# Patient Record
Sex: Male | Born: 1990 | Race: White | Hispanic: No | Marital: Single | Smoking: Former smoker
Health system: Southern US, Community
[De-identification: ages and names within clinical notes are randomized; demographics above are authoritative.]

## PROBLEM LIST (undated history)

## (undated) DIAGNOSIS — Z8774 Personal history of (corrected) congenital malformations of heart and circulatory system: Secondary | ICD-10-CM

## (undated) DIAGNOSIS — S52121A Displaced fracture of head of right radius, initial encounter for closed fracture: Secondary | ICD-10-CM

## (undated) HISTORY — PX: CARDIAC SURGERY: SHX584

---

## 2016-03-21 ENCOUNTER — Ambulatory Visit (INDEPENDENT_AMBULATORY_CARE_PROVIDER_SITE_OTHER): Payer: PRIVATE HEALTH INSURANCE | Admitting: Emergency Medicine

## 2016-03-21 VITALS — BP 128/76 | HR 76 | Temp 98.8°F | Resp 18 | Ht 68.0 in | Wt 146.0 lb

## 2016-03-21 DIAGNOSIS — T1592XA Foreign body on external eye, part unspecified, left eye, initial encounter: Secondary | ICD-10-CM

## 2016-03-21 NOTE — Patient Instructions (Addendum)
You have an appointment at [facility] today at 230.  Address: [address] [place], [place], [place] [address].  Phone: [phone]      IF you received an x-ray today, you will receive an invoice from [facility]. Please contact [facility] at [phone] with questions or concerns regarding your invoice.   IF you received labwork today, you will receive an invoice from [facility]. Please contact [facility] at [phone] with questions or concerns regarding your invoice.   Our billing staff will not be able to assist you with questions regarding bills from these companies.  You will be contacted with the lab results as soon as they are available. The fastest way to get your results is to activate your My Chart account. Instructions are located on the last page of this paperwork. If you have not heard from us regarding the results in 2 weeks, please contact this office.

## 2016-03-21 NOTE — Progress Notes (Signed)
Patient ID: Frederick Mills, male   DOB: 07/02/1991, 25 y.o.   MRN: [id number]    By signing my name below, I, Essence Howell, attest that this documentation has been prepared under the direction and in the presence of Collene GobbleSteven A Carmino Ocain, MD Electronically Signed: Charline BillsEssence Howell, ED Scribe 03/21/2016 at 1:48 PM.  Chief Complaint:  Chief Complaint  Patient presents with  . Eye Pain    pt has a piece of metal in his left eye. redness/ x 1 day   HPI: Frederick Mills is a 25 y.o. male who [facility] complaining of persistent left eye pain onset yesterday. 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 He states that he feels the metal in his eye every time he blinks. Pt was wearing safety glasses during the incident. He reports associated left eye redness onset yesterday. Pt has tried flushing his eye without significant relief. He denies any symptoms in his right eye. No known drug allergies.   Pt is self-employed with [employer].   Past Medical History  Diagnosis Date  . Heart murmur    No past surgical history on file. Social History   Social History  . Marital Status: Single    Spouse Name: N/A  . Number of Children: N/A  . Years of Education: N/A   Social History Main Topics  . Smoking status: Never Smoker   . Smokeless tobacco: None  . Alcohol Use: None  . Drug Use: None  . Sexual Activity: Not Asked   Other Topics Concern  . None   Social History Narrative  . None   History reviewed. No pertinent family history. Not on File Prior to Admission medications   Not on File   ROS: The patient denies fevers, chills, night sweats, unintentional weight loss, chest pain, palpitations, wheezing, dyspnea on exertion, nausea, vomiting, abdominal pain, dysuria, hematuria, melena, numbness, weakness, or tingling. +eye pain, +eye redness  All other systems have been reviewed and were otherwise  negative with the exception of those mentioned in the HPI and as above.    PHYSICAL EXAM: Filed Vitals:   03/21/16 1325  BP: 128/76  Pulse: 76  Temp: 98.8 F (37.1 C)  Resp: 18   Body mass index is 22.2 kg/(m^2).  General: Alert, no acute distress HEENT:  Normocephalic, atraumatic, oropharynx patent. [facility]. L eye: tiny 1-2 mm retained foreign body at 10 o'clock.  Cardiovascular: Regular rate and rhythm, no rubs murmurs or gallops. No Carotid bruits, radial pulse intact. No pedal edema.  Respiratory: Clear to auscultation bilaterally. No wheezes, rales, or rhonchi. No cyanosis, no use of accessory musculature Abdominal: No organomegaly, abdomen is soft and non-tender, positive bowel sounds. No masses. Musculoskeletal: Gait intact. No edema, tenderness Skin: No rashes. Neurologic: Facial musculature symmetric. Psychiatric: Patient acts appropriately throughout our interaction. Lymphatic: No cervical or submandibular lymphadenopathy  LABS:  EKG/XRAY:   Primary read interpreted by [facility] Cle[facility]tsaub at [facility].  PROCEDURES: 3 drops of pontocaine instilled. Lid everted and swabbed. Attempts made to remove small foreign body with moist Q-tip without success. Pt tolerated it well. Unable to remove foreign body.   ASSESSMENT/PLAN: [facility]. [facility] was called. Pt will be sent there for evaluation and treatment. I was unable to remove with cotton swab. I personally performed the services described in this documentation, which was scribed in my presence. The recorded information has been reviewed and is  accurate.   Gross sideeffects, risk and benefits, and alternatives of medications d/w patient. Patient is aware that all medications have potential sideeffects and we are unable to predict every sideeffect or drug-drug interaction that may occur.  Lesle Chris MD 03/21/2016 1:32 PM

## 2017-03-12 ENCOUNTER — Other Ambulatory Visit: Payer: Self-pay | Admitting: Orthopedic Surgery

## 2017-03-12 DIAGNOSIS — M25521 Pain in right elbow: Secondary | ICD-10-CM

## 2017-03-12 DIAGNOSIS — M25522 Pain in left elbow: Principal | ICD-10-CM

## 2017-03-13 ENCOUNTER — Ambulatory Visit
Admission: RE | Admit: 2017-03-13 | Discharge: 2017-03-13 | Disposition: A | Discharge status: Home / Self Care | Payer: 59 | Source: Ambulatory Visit | Attending: Orthopedic Surgery | Admitting: Orthopedic Surgery

## 2017-03-13 DIAGNOSIS — M25521 Pain in right elbow: Secondary | ICD-10-CM

## 2017-03-13 DIAGNOSIS — M25522 Pain in left elbow: Principal | ICD-10-CM

## 2017-03-16 ENCOUNTER — Ambulatory Visit (HOSPITAL_COMMUNITY): Payer: 59 | Attending: Cardiology

## 2017-03-16 ENCOUNTER — Ambulatory Visit (INDEPENDENT_AMBULATORY_CARE_PROVIDER_SITE_OTHER): Payer: 59 | Admitting: Cardiovascular Disease

## 2017-03-16 ENCOUNTER — Other Ambulatory Visit: Payer: Self-pay

## 2017-03-16 ENCOUNTER — Encounter: Payer: Self-pay | Admitting: Cardiovascular Disease

## 2017-03-16 DIAGNOSIS — Z01818 Encounter for other preprocedural examination: Secondary | ICD-10-CM

## 2017-03-16 DIAGNOSIS — I081 Rheumatic disorders of both mitral and tricuspid valves: Secondary | ICD-10-CM | POA: Insufficient documentation

## 2017-03-16 NOTE — Assessment & Plan Note (Signed)
Frederick Mills was referred by Dr. Eulah PontMurphy for preoperative clearance for right elbow surgery. 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 He has no cardiac risk factors. He apparently had congenital heart surgery at age 26 or [age] at [facility] for a "murmur". He does not know his initial pathology nor has he had follow-up. He exercises without limitation and has no symptoms. I'm going to [facility] and 2 attempts thoracic echocardiogram. If his echo is normal we will clear him for his upcoming surgery at low risk.

## 2017-03-16 NOTE — Addendum Note (Signed)
Addended by: Chana BodeGREEN, Lindey Renzulli L on: 03/16/2017 10:48 AM   Modules accepted: Orders

## 2017-03-16 NOTE — Progress Notes (Signed)
     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 He was referred by Dr. Renaye Rakersim Murphy for preoperative clearance before right elbow surgery performed after he broke his elbow during a skateboarding accident. He is on no medications. He has no cardiac risk factors. It sounds like he had congenital heart disease and was operated on at [facility] when he was [age] or 89 years old. He had no limitation since. He is unaware of his original pathology.   No current outpatient prescriptions on file.   No current facility-administered medications for this visit.     Not on File  Social History   Social History  . Marital status: Single    Spouse name: N/A  . Number of children: N/A  . Years of education: N/A   Occupational History  . Not on file.   Social History Main Topics  . Smoking status: Current Some Day Smoker  . Smokeless tobacco: Never Used  . Alcohol use Not on file  . Drug use: Unknown  . Sexual activity: Not on file   Other Topics Concern  . Not on file   Social History Narrative  . No narrative on file     Review of Systems: General: negative for chills, fever, night sweats or weight changes.  Cardiovascular: negative for chest pain, dyspnea on exertion, edema, orthopnea, palpitations, paroxysmal nocturnal dyspnea or shortness of breath Dermatological: negative for rash Respiratory: negative for cough or wheezing Urologic: negative for hematuria Abdominal: negative for nausea, vomiting, diarrhea, bright red blood per rectum, melena, or hematemesis Neurologic: negative for visual changes, syncope, or dizziness All other systems reviewed and are otherwise negative except as noted above.    Blood  pressure 129/83, pulse 94, height 6\' 2"  (1.88 m), weight 156 lb 6.4 oz (70.9 kg), SpO2 99 %.  General appearance: alert and no distress Neck: no adenopathy, no carotid bruit, no JVD, supple, symmetrical, trachea midline and thyroid not enlarged, symmetric, no tenderness/mass/nodules Lungs: clear to auscultation bilaterally Heart: regular rate and rhythm, S1, S2 normal, no murmur, click, rub or gallop Extremities: extremities normal, atraumatic, no cyanosis or edema  EKG normal sinus rhythm at 86 with incomplete right bundle branch block. I personally reviewed this EKG.  ASSESSMENT AND PLAN:   Preoperative clearance Frederick Mills was referred by Dr. Eulah PontMurphy for preoperative clearance for right elbow surgery. 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 He has no cardiac risk factors. He apparently had congenital heart surgery at age 26 or [age] at [facility] for a "murmur". He does not know his initial pathology nor has he had follow-up. He exercises without limitation and has no symptoms. I'm going to [facility] and 2 attempts thoracic echocardiogram. If his echo is normal we will clear him for his upcoming surgery at low risk.      Runell GessJonathan J. Analise Glotfelty MD FACP,FACC,FAHA, [facility] 03/16/2017 9:37 AM

## 2017-03-16 NOTE — Addendum Note (Signed)
Addended by: Kandice RobinsonsYOUNG, Ibrahima Holberg T on: 03/16/2017 09:57 AM   Modules accepted: Orders

## 2017-03-16 NOTE — Patient Instructions (Addendum)
Medication Instructions:  Your physician recommends that you continue on your current medications as directed. Please refer to the Current Medication list given to you today.  Labwork: NONE   Testing/Procedures: Your physician has requested that you have an echocardiogram. Echocardiography is a painless test that uses sound waves to create images of your heart. It provides your doctor with information about the size and shape of your heart and how well your heart's chambers and valves are working. This procedure takes approximately one hour. There are no restrictions for this procedure.   Follow-Up: Your physician recommends that you schedule a follow-up appointment in: 2 WEEKS WITH DR Allyson SabalBERRY  Any Other Special Instructions Will Be Listed Below (If Applicable).     If you need a refill on your cardiac medications before your next appointment, please call your pharmacy.

## 2017-03-17 LAB — ECHOCARDIOGRAM COMPLETE
Height: 74 in
Weight: 2502.4 oz

## 2017-03-21 ENCOUNTER — Telehealth: Payer: Self-pay | Admitting: Cardiovascular Disease

## 2017-03-21 ENCOUNTER — Ambulatory Visit: Payer: 59 | Admitting: Cardiology

## 2017-03-21 ENCOUNTER — Encounter: Payer: Self-pay | Admitting: Cardiovascular Disease

## 2017-03-21 NOTE — Telephone Encounter (Signed)
ECHOCARDIOGRAM COMPLETE  Order: [id number]  Status:  Final result Visible to patient:  No (Not Released) Dx:  Preoperative clearance  Notes recorded by Evans Lanceaylor W Savannah Morford on 03/21/2017 at 3:14 PM EDT Results given to pt. Pt verbalized understanding. Sending Clearance letter to [facility] at fax # [phone]. ------  Notes recorded by Runell GessJonathan J Berry, MD on 03/18/2017 at 4:24 PM EDT Overall a nl study. Cleared for his ortho surgery at low risk.

## 2017-03-21 NOTE — Telephone Encounter (Deleted)
-----   Message from Runell GessJonathan J Berry, MD sent at 03/18/2017  4:24 PM EDT ----- Overall a nl study. Cleared for his ortho surgery at low risk.  JJB

## 2017-03-29 ENCOUNTER — Encounter (HOSPITAL_BASED_OUTPATIENT_CLINIC_OR_DEPARTMENT_OTHER): Payer: Self-pay | Admitting: *Deleted

## 2017-03-29 DIAGNOSIS — S52121A Displaced fracture of head of right radius, initial encounter for closed fracture: Secondary | ICD-10-CM

## 2017-03-29 NOTE — H&P (Signed)
[facility] [address]   [address] [place] Mills [place] [address] [phone] A Division of [facility]  RE: Frederick, [place]   [id number]      DOB: 12/28/1990  [facility] [address]   [address] [place] [address] [phone] A Division of [facility]                                                                     RE: Frederick, Mills   [id number]   11-14-1991 03-12-17 Reason for visit: Urgent Care follow up for bilateral proximal radial head fractures.  Date of injury: March 04, 2017-fell while skateboarding.   History of present illness: This is an acute traumatic problem that began on March 04, 2017 when skateboarding.  He fell onto an outstretched right hand.  He presented to Urgent Care where x-rays showed bilateral radial head fractures.  He was referred for potential surgical evaluation.  Since the injury he hasn't had much in the way of significant pain.  He has decent range of motion, mainly decreased in extension more so on the right.  The right is more sore.  He is not taking any medicines.  He has no numbness in his hands.    EXAMINATION: Well appearing and in no acute distress.  Arrives in a sling.  Neurovascularly intact bilaterally.  Bilateral elbows with moderate effusion, more significant on the right.  Range of motion limited on the right by about 25 degrees versus 15 degrees on the left.  Mild tenderness to palpation over the radial head with deep palpation and rotation.  Good rotational range of motion bilaterally.    X-RAYS: Three views of bilateral elbows show a proximal radial head fracture with what appears to be step off at the joint on the right and left side shows a proximal radius head fracture.  Joint line appears to be intact.  ASSESSMENT/PLAN: Bilateral proximal radial head fractures.  We will plan for CT of both elbows to  further evaluate the nature of each fracture.  Most concern is for the right side which given the apparent step off of the joint may require operative fixation.  Detailed risks and benefits of surgery versus non-operative treatment discussed at length and the patient verbalized understanding.  We will follow up with him hopefully on Wednesday or Friday of this week to review CT results.    Frederick Mills.  Frederick Mills, M.D.  Dictated by: Frederick Epley, PA-C Electronically verified by Frederick Mills Frederick Mills, M.D. TDM(HCM):jjh D 03-12-17 T 03-13-17  03-14-17 Reason for visit: Follow-up CT of the bilateral elbows. He has bilateral radial head fractures. No complaints at this time. His date of injury was 03-04-17. He had a CT on the right side which shows a significant depression of the fracture piece with a step off on the left side. It is minimally displaced.    ASSESSMENT/PLAN:  We had a long talk about his options. I would recommend open reduction internal fixation of this piece on the right side. Nonoperative management of the left. He has an extensive cardiac history and will get a cardiac evaluation prior to doing this.     Frederick Mills.  Frederick Mills, M.D. Electronically verified  by Frederick Mills Frederick Mills, M.D. TDMNorman Mills  D  03-16-17 T   03-16-17

## 2017-04-03 ENCOUNTER — Ambulatory Visit (HOSPITAL_BASED_OUTPATIENT_CLINIC_OR_DEPARTMENT_OTHER)
Admission: RE | Admit: 2017-04-03 | Discharge: 2017-04-03 | Disposition: A | Discharge status: Home / Self Care | Payer: 59 | Source: Ambulatory Visit | Attending: Orthopedic Surgery | Admitting: Orthopedic Surgery

## 2017-04-03 ENCOUNTER — Encounter (HOSPITAL_BASED_OUTPATIENT_CLINIC_OR_DEPARTMENT_OTHER)
Admission: RE | Disposition: A | Discharge status: Home / Self Care | Payer: Self-pay | Source: Ambulatory Visit | Attending: Orthopedic Surgery

## 2017-04-03 ENCOUNTER — Ambulatory Visit (HOSPITAL_BASED_OUTPATIENT_CLINIC_OR_DEPARTMENT_OTHER): Payer: 59 | Admitting: Certified Registered Nurse Anesthetist

## 2017-04-03 ENCOUNTER — Encounter (HOSPITAL_BASED_OUTPATIENT_CLINIC_OR_DEPARTMENT_OTHER): Payer: Self-pay | Admitting: Certified Registered Nurse Anesthetist

## 2017-04-03 DIAGNOSIS — Y9351 Activity, roller skating (inline) and skateboarding: Secondary | ICD-10-CM | POA: Insufficient documentation

## 2017-04-03 DIAGNOSIS — Z87891 Personal history of nicotine dependence: Secondary | ICD-10-CM | POA: Diagnosis not present

## 2017-04-03 DIAGNOSIS — S52121A Displaced fracture of head of right radius, initial encounter for closed fracture: Secondary | ICD-10-CM | POA: Diagnosis present

## 2017-04-03 DIAGNOSIS — Z8774 Personal history of (corrected) congenital malformations of heart and circulatory system: Secondary | ICD-10-CM | POA: Insufficient documentation

## 2017-04-03 HISTORY — PX: ORIF RADIAL FRACTURE: SHX5113

## 2017-04-03 HISTORY — DX: Personal history of (corrected) congenital malformations of heart and circulatory system: Z87.74

## 2017-04-03 HISTORY — DX: Displaced fracture of head of right radius, initial encounter for closed fracture: S52.121A

## 2017-04-03 SURGERY — OPEN REDUCTION INTERNAL FIXATION (ORIF) RADIAL FRACTURE
Anesthesia: Regional | Site: Arm Lower | Laterality: Right

## 2017-04-03 MED ORDER — GABAPENTIN 300 MG PO CAPS: 300.0000 mg | ORAL_CAPSULE | Freq: Once | ORAL | Status: DC

## 2017-04-03 MED ORDER — ONDANSETRON HCL 4 MG/2ML IJ SOLN
INTRAMUSCULAR | Status: DC | PRN
  Administered 2017-04-03: 4 mg via INTRAVENOUS

## 2017-04-03 MED ORDER — HYDROCODONE-ACETAMINOPHEN 5-325 MG PO TABS: 1.0000 | ORAL_TABLET | ORAL | 0 refills | Status: AC | PRN

## 2017-04-03 MED ORDER — ONDANSETRON HCL 4 MG PO TABS: 4.0000 mg | ORAL_TABLET | Freq: Three times a day (TID) | ORAL | 0 refills | Status: AC | PRN

## 2017-04-03 MED ORDER — LIDOCAINE 2% (20 MG/ML) 5 ML SYRINGE
INTRAMUSCULAR | Status: AC
  Filled 2017-04-03: qty 5

## 2017-04-03 MED ORDER — DEXAMETHASONE SODIUM PHOSPHATE 10 MG/ML IJ SOLN
INTRAMUSCULAR | Status: AC
  Filled 2017-04-03: qty 1

## 2017-04-03 MED ORDER — MEPERIDINE HCL 25 MG/ML IJ SOLN: 6.2500 mg | INTRAMUSCULAR | Status: DC | PRN

## 2017-04-03 MED ORDER — PROPOFOL 10 MG/ML IV BOLUS
INTRAVENOUS | Status: DC | PRN
  Administered 2017-04-03: 200 mg via INTRAVENOUS

## 2017-04-03 MED ORDER — PROPOFOL 10 MG/ML IV BOLUS
INTRAVENOUS | Status: AC
  Filled 2017-04-03: qty 20

## 2017-04-03 MED ORDER — CEFAZOLIN SODIUM-DEXTROSE 2-4 GM/100ML-% IV SOLN
INTRAVENOUS | Status: AC
  Filled 2017-04-03: qty 100

## 2017-04-03 MED ORDER — LACTATED RINGERS IV SOLN
INTRAVENOUS | Status: DC
  Administered 2017-04-03 (×2): via INTRAVENOUS

## 2017-04-03 MED ORDER — ROPIVACAINE HCL 5 MG/ML IJ SOLN
INTRAMUSCULAR | Status: DC | PRN
  Administered 2017-04-03: 30 mL via PERINEURAL

## 2017-04-03 MED ORDER — LIDOCAINE HCL (CARDIAC) 20 MG/ML IV SOLN
INTRAVENOUS | Status: DC | PRN
  Administered 2017-04-03: 80 mg via INTRAVENOUS

## 2017-04-03 MED ORDER — ONDANSETRON HCL 4 MG/2ML IJ SOLN
INTRAMUSCULAR | Status: AC
  Filled 2017-04-03: qty 2

## 2017-04-03 MED ORDER — FENTANYL CITRATE (PF) 100 MCG/2ML IJ SOLN
INTRAMUSCULAR | Status: AC
  Filled 2017-04-03: qty 2

## 2017-04-03 MED ORDER — DEXAMETHASONE SODIUM PHOSPHATE 4 MG/ML IJ SOLN
INTRAMUSCULAR | Status: DC | PRN
  Administered 2017-04-03: 5 mg via INTRAVENOUS

## 2017-04-03 MED ORDER — PROMETHAZINE HCL 25 MG/ML IJ SOLN: 6.2500 mg | INTRAMUSCULAR | Status: DC | PRN

## 2017-04-03 MED ORDER — OXYCODONE HCL 5 MG/5ML PO SOLN: 5.0000 mg | Freq: Once | ORAL | Status: DC | PRN

## 2017-04-03 MED ORDER — ACETAMINOPHEN 500 MG PO TABS
ORAL_TABLET | ORAL | Status: AC
  Filled 2017-04-03: qty 2

## 2017-04-03 MED ORDER — FENTANYL CITRATE (PF) 100 MCG/2ML IJ SOLN
50.0000 ug | INTRAMUSCULAR | Status: AC | PRN
  Administered 2017-04-03 (×2): 25 ug via INTRAVENOUS
  Administered 2017-04-03: 100 ug via INTRAVENOUS
  Administered 2017-04-03: 50 ug via INTRAVENOUS

## 2017-04-03 MED ORDER — HYDROMORPHONE HCL 1 MG/ML IJ SOLN: 0.2500 mg | INTRAMUSCULAR | Status: DC | PRN

## 2017-04-03 MED ORDER — MIDAZOLAM HCL 2 MG/2ML IJ SOLN
1.0000 mg | INTRAMUSCULAR | Status: DC | PRN
  Administered 2017-04-03: 1 mg via INTRAVENOUS
  Administered 2017-04-03: 2 mg via INTRAVENOUS

## 2017-04-03 MED ORDER — OXYCODONE HCL 5 MG PO TABS: 5.0000 mg | ORAL_TABLET | Freq: Once | ORAL | Status: DC | PRN

## 2017-04-03 MED ORDER — MIDAZOLAM HCL 2 MG/2ML IJ SOLN
INTRAMUSCULAR | Status: AC
  Filled 2017-04-03: qty 2

## 2017-04-03 MED ORDER — CEFAZOLIN SODIUM-DEXTROSE 2-4 GM/100ML-% IV SOLN
2.0000 g | INTRAVENOUS | Status: AC
  Administered 2017-04-03: 2 g via INTRAVENOUS

## 2017-04-03 MED ORDER — CHLORHEXIDINE GLUCONATE 4 % EX LIQD: 60.0000 mL | Freq: Once | CUTANEOUS | Status: DC

## 2017-04-03 MED ORDER — GABAPENTIN 300 MG PO CAPS
ORAL_CAPSULE | ORAL | Status: AC
  Filled 2017-04-03: qty 1

## 2017-04-03 MED ORDER — LACTATED RINGERS IV SOLN
INTRAVENOUS | Status: DC
  Administered 2017-04-03: 13:00:00 via INTRAVENOUS

## 2017-04-03 MED ORDER — SCOPOLAMINE 1 MG/3DAYS TD PT72: 1.0000 | MEDICATED_PATCH | Freq: Once | TRANSDERMAL | Status: DC | PRN

## 2017-04-03 MED ORDER — ACETAMINOPHEN 500 MG PO TABS
1000.0000 mg | ORAL_TABLET | Freq: Once | ORAL | Status: AC
  Administered 2017-04-03: 1000 mg via ORAL

## 2017-04-03 SURGICAL SUPPLY — 69 items
BANDAGE ACE 4X5 VEL STRL LF (GAUZE/BANDAGES/DRESSINGS) ×3 IMPLANT
BIT DRILL CANN 2.4 (BIT) ×2
BIT DRILL CANN 2.4MM (BIT) ×1
BIT DRILL CANN MAX VPC 2.4 (BIT) ×1 IMPLANT
BLADE SURG 15 STRL LF DISP TIS (BLADE) ×2 IMPLANT
BLADE SURG 15 STRL SS (BLADE) ×4
BNDG ESMARK 4X9 LF (GAUZE/BANDAGES/DRESSINGS) ×3 IMPLANT
CHLORAPREP W/TINT 26ML (MISCELLANEOUS) ×3 IMPLANT
CLOSURE STERI-STRIP 1/2X4 (GAUZE/BANDAGES/DRESSINGS) ×1
CLSR STERI-STRIP ANTIMIC 1/2X4 (GAUZE/BANDAGES/DRESSINGS) ×2 IMPLANT
CORDS BIPOLAR (ELECTRODE) ×3 IMPLANT
COVER BACK TABLE 60X90IN (DRAPES) ×3 IMPLANT
CUFF TOURN SGL LL 18 NRW (TOURNIQUET CUFF) ×3 IMPLANT
CUFF TOURNIQUET SINGLE 18IN (TOURNIQUET CUFF) IMPLANT
CUFF TOURNIQUET SINGLE 24IN (TOURNIQUET CUFF) IMPLANT
DECANTER SPIKE VIAL GLASS SM (MISCELLANEOUS) IMPLANT
DRAPE EXTREMITY T 121X128X90 (DRAPE) ×3 IMPLANT
DRAPE IMP U-DRAPE 54X76 (DRAPES) ×3 IMPLANT
DRAPE OEC MINIVIEW 54X84 (DRAPES) ×3 IMPLANT
DRAPE SURG 17X23 STRL (DRAPES) ×3 IMPLANT
DRSG EMULSION OIL 3X3 NADH (GAUZE/BANDAGES/DRESSINGS) ×3 IMPLANT
GAUZE SPONGE 4X4 12PLY STRL (GAUZE/BANDAGES/DRESSINGS) ×3 IMPLANT
GAUZE XEROFORM 1X8 LF (GAUZE/BANDAGES/DRESSINGS) IMPLANT
GLOVE BIO SURGEON STRL SZ 6.5 (GLOVE) ×2 IMPLANT
GLOVE BIO SURGEON STRL SZ7.5 (GLOVE) ×6 IMPLANT
GLOVE BIO SURGEONS STRL SZ 6.5 (GLOVE) ×1
GLOVE BIOGEL PI IND STRL 7.0 (GLOVE) ×1 IMPLANT
GLOVE BIOGEL PI IND STRL 8 (GLOVE) ×2 IMPLANT
GLOVE BIOGEL PI INDICATOR 7.0 (GLOVE) ×2
GLOVE BIOGEL PI INDICATOR 8 (GLOVE) ×4
GOWN STRL REUS W/ TWL LRG LVL3 (GOWN DISPOSABLE) ×2 IMPLANT
GOWN STRL REUS W/ TWL XL LVL3 (GOWN DISPOSABLE) ×1 IMPLANT
GOWN STRL REUS W/TWL LRG LVL3 (GOWN DISPOSABLE) ×4
GOWN STRL REUS W/TWL XL LVL3 (GOWN DISPOSABLE) ×2
K-WIRE COCR 0.9X95 (WIRE) ×3
K-WIRE COCR 1.1X105 (WIRE) ×3
KWIRE COCR 0.9X95 (WIRE) ×1 IMPLANT
KWIRE COCR 1.1X105 (WIRE) ×1 IMPLANT
NEEDLE HYPO 25X1 1.5 SAFETY (NEEDLE) ×3 IMPLANT
NS IRRIG 1000ML POUR BTL (IV SOLUTION) ×3 IMPLANT
PACK BASIN DAY SURGERY FS (CUSTOM PROCEDURE TRAY) ×3 IMPLANT
PAD CAST 4YDX4 CTTN HI CHSV (CAST SUPPLIES) ×1 IMPLANT
PADDING CAST COTTON 4X4 STRL (CAST SUPPLIES) ×2
SCREW CANN MAX VPC 3.4X20 (Screw) ×1 IMPLANT
SCREW VPS 3.4X20MM (Screw) ×2 IMPLANT
SLEEVE SCD COMPRESS KNEE MED (MISCELLANEOUS) ×3 IMPLANT
SPLINT FAST PLASTER 5X30 (CAST SUPPLIES)
SPLINT PLASTER CAST FAST 5X30 (CAST SUPPLIES) IMPLANT
SPLINT PLASTER CAST XFAST 3X15 (CAST SUPPLIES) IMPLANT
SPLINT PLASTER CAST XFAST 4X15 (CAST SUPPLIES) IMPLANT
SPLINT PLASTER XTRA FAST SET 4 (CAST SUPPLIES)
SPLINT PLASTER XTRA FASTSET 3X (CAST SUPPLIES)
SUCTION FRAZIER HANDLE 10FR (MISCELLANEOUS) ×2
SUCTION TUBE FRAZIER 10FR DISP (MISCELLANEOUS) ×1 IMPLANT
SUT ETHILON 3 0 PS 1 (SUTURE) IMPLANT
SUT MON AB 2-0 CT1 36 (SUTURE) ×3 IMPLANT
SUT MON AB 4-0 PC3 18 (SUTURE) ×3 IMPLANT
SUT PROLENE 3 0 PS 2 (SUTURE) IMPLANT
SUT VIC AB 0 SH 27 (SUTURE) ×3 IMPLANT
SUT VIC AB 2-0 SH 27 (SUTURE)
SUT VIC AB 2-0 SH 27XBRD (SUTURE) IMPLANT
SUT VIC AB 3-0 FS2 27 (SUTURE) IMPLANT
SYR BULB 3OZ (MISCELLANEOUS) ×3 IMPLANT
SYR CONTROL 10ML LL (SYRINGE) IMPLANT
TOWEL OR 17X24 6PK STRL BLUE (TOWEL DISPOSABLE) ×3 IMPLANT
TOWEL OR NON WOVEN STRL DISP B (DISPOSABLE) ×3 IMPLANT
TUBE CONNECTING 20'X1/4 (TUBING) ×1
TUBE CONNECTING 20X1/4 (TUBING) ×2 IMPLANT
UNDERPAD 30X30 (UNDERPADS AND DIAPERS) ×3 IMPLANT

## 2017-04-03 NOTE — Anesthesia Postprocedure Evaluation (Signed)
Anesthesia Post Note  Patient: Frederick Mills  Procedure(s) Performed: Procedure(s) (LRB): OPEN REDUCTION INTERNAL FIXATION (ORIF) RIGHT PROXIMAL RADIAL FRACTURE (Right)  Patient location during evaluation: PACU Anesthesia Type: Regional Level of consciousness: awake and alert Pain management: pain level controlled Vital Signs Assessment: post-procedure vital signs reviewed and stable Respiratory status: spontaneous breathing, nonlabored ventilation and respiratory function stable Cardiovascular status: blood pressure returned to baseline and stable Postop Assessment: no signs of nausea or vomiting Anesthetic complications: no       Last Vitals:  Vitals:   04/03/17 1545 04/03/17 1600  BP: 111/69 125/82  Pulse: 69   Resp: 13   Temp:  36.4 C    Last Pain:  Vitals:   04/03/17 1514  TempSrc:   PainSc: Asleep                 Lowella Curb

## 2017-04-03 NOTE — Anesthesia Preprocedure Evaluation (Signed)
Anesthesia Evaluation  Patient identified by MRN, date of birth, ID band Patient awake    Reviewed: Allergy & Precautions, NPO status , Patient's Chart, lab work & pertinent test results  Airway Mallampati: I  TM Distance: >3 FB Neck ROM: Full    Dental no notable dental hx.    Pulmonary neg pulmonary ROS, former smoker,    Pulmonary exam normal breath sounds clear to auscultation       Cardiovascular Normal cardiovascular exam Rhythm:Regular Rate:Normal  Congenital heart disease.  Surgically corrected.   Neuro/Psych negative neurological ROS  negative psych ROS   GI/Hepatic negative GI ROS, Neg liver ROS,   Endo/Other  negative endocrine ROS  Renal/GU negative Renal ROS  negative genitourinary   Musculoskeletal negative musculoskeletal ROS (+)   Abdominal   Peds negative pediatric ROS (+)  Hematology negative hematology ROS (+)   Anesthesia Other Findings   Reproductive/Obstetrics negative OB ROS                             Anesthesia Physical Anesthesia Plan  ASA: II  Anesthesia Plan: General and Regional   Post-op Pain Management: GA combined w/ Regional for post-op pain   Induction: Intravenous  Airway Management Planned: LMA  Additional Equipment:   Intra-op Plan:   Post-operative Plan: Extubation in OR  Informed Consent: I have reviewed the patients History and Physical, chart, labs and discussed the procedure including the risks, benefits and alternatives for the proposed anesthesia with the patient or authorized representative who has indicated his/her understanding and acceptance.   Dental advisory given  Plan Discussed with: CRNA  Anesthesia Plan Comments:         Anesthesia Quick Evaluation

## 2017-04-03 NOTE — Interval H&P Note (Signed)
History and Physical Interval Note:  04/03/2017 2:05 PM  Frederick Mills  has presented today for surgery, with the diagnosis of RIGHT RADIAL HEAD FRACTURE  The various methods of treatment have been discussed with the patient and family. After consideration of risks, benefits and other options for treatment, the patient has consented to  Procedure(s): OPEN REDUCTION INTERNAL FIXATION (ORIF) RIGHT PROXIMAL RADIAL FRACTURE (Right) as a surgical intervention .  The patient's history has been reviewed, patient examined, no change in status, stable for surgery.  I have reviewed the patient's chart and labs.  Questions were answered to the patient's satisfaction.     Krystopher Kuenzel D

## 2017-04-03 NOTE — Anesthesia Procedure Notes (Addendum)
Anesthesia Regional Block: Supraclavicular block   Pre-Anesthetic Checklist: ,, timeout performed, Correct Patient, Correct Site, Correct Laterality, Correct Procedure, Correct Position, site marked, Risks and benefits discussed,  Surgical consent,  Pre-op evaluation,  At surgeon's request and post-op pain management  Laterality: Right  Prep: Dura Prep       Needles:  Injection technique: Single-shot  Needle Type: Stimiplex     Needle Length: 9cm  Needle Gauge: 21     Additional Needles:   Procedures: ultrasound guided,,,,,,,,  Narrative:  Start time: 04/03/2017 1:20 PM End time: 04/03/2017 1:28 PM Injection made incrementally with aspirations every 5 mL.  Performed by: Personally  Anesthesiologist: Anitra Lauth RAY

## 2017-04-03 NOTE — Discharge Instructions (Signed)
Diet: As you were doing prior to hospitalization   Shower:  May shower but keep the wounds dry, use an occlusive plastic wrap, NO SOAKING IN TUB.  If the bandage gets wet, change with a clean dry gauze.  If you have a splint on, leave the splint in place and keep the splint dry with a plastic bag.  Dressing:  Leave dressings on and dry until follow up.  Activity:  Increase activity slowly as tolerated, but follow the weight bearing instructions below.  The rules on driving is that you can not be taking narcotics while you drive, and you must feel in control of the vehicle.    Weight Bearing:  Do not bear weight with arm until follow up.  Maintain sling.    To prevent constipation: you may use a stool softener such as -  Colace (over the counter) 100 mg by mouth twice a day  Drink plenty of fluids (prune juice may be helpful) and high fiber foods Miralax (over the counter) for constipation as needed.    Itching:  If you experience itching with your medications, try taking only a single pain pill, or even half a pain pill at a time.  You may take up to 10 pain pills per day, and you can also use benadryl over the counter for itching or also to help with sleep.   Precautions:  If you experience chest pain or shortness of breath - call 911 immediately for transfer to the hospital emergency department!!  If you develop a fever greater that 101 F, purulent drainage from wound, increased redness or drainage from wound, or calf pain -- Call the office at [phone]                                                Follow- Up Appointment:  Please call for an appointment to be seen in 2 weeks [place] - [phone]  Post Anesthesia Home Care Instructions  Activity: Get plenty of rest for the remainder of the day. A responsible individual must stay with you for 24 hours following the procedure.  For the next 24 hours, DO NOT: -Drive a car -Advertising copywriter -Drink alcoholic beverages -Take  any medication unless instructed by your physician -Make any legal decisions or sign important papers.  Meals: Start with liquid foods such as gelatin or soup. Progress to regular foods as tolerated. Avoid greasy, spicy, heavy foods. If nausea and/or vomiting occur, drink only clear liquids until the nausea and/or vomiting subsides. Call your physician if vomiting continues.  Special Instructions/Symptoms: Your throat may feel dry or sore from the anesthesia or the breathing tube placed in your throat during surgery. If this causes discomfort, gargle with warm salt water. The discomfort should disappear within 24 hours.  If you had a scopolamine patch placed behind your ear for the management of post- operative nausea and/or vomiting:  1. The medication in the patch is effective for 72 hours, after which it should be removed.  Wrap patch in a tissue and discard in the trash. Wash hands thoroughly with soap and water. 2. You may remove the patch earlier than 72 hours if you experience unpleasant side effects which may include dry mouth, dizziness or visual disturbances. 3. Avoid touching the patch. Wash your hands with soap and water after contact with the patch.

## 2017-04-03 NOTE — Anesthesia Procedure Notes (Signed)
Procedure Name: LMA Insertion Date/Time: 04/03/2017 1:54 PM Performed by: Cleda Clarks Pre-anesthesia Checklist: Patient identified, Emergency Drugs available, Suction available and Patient being monitored Patient Re-evaluated:Patient Re-evaluated prior to inductionOxygen Delivery Method: Circle system utilized Preoxygenation: Pre-oxygenation with 100% oxygen Intubation Type: IV induction Ventilation: Mask ventilation without difficulty LMA: LMA inserted LMA Size: 4.0 Number of attempts: 1 Placement Confirmation: positive ETCO2 and breath sounds checked- equal and bilateral Tube secured with: Tape Dental Injury: Teeth and Oropharynx as per pre-operative assessment

## 2017-04-03 NOTE — Transfer of Care (Signed)
Immediate Anesthesia Transfer of Care Note  Patient: Marcella Nurse  Procedure(s) Performed: Procedure(s): OPEN REDUCTION INTERNAL FIXATION (ORIF) RIGHT PROXIMAL RADIAL FRACTURE (Right)  Patient Location: PACU  Anesthesia Type:GA combined with regional for post-op pain  Level of Consciousness: sedated  Airway & Oxygen Therapy: Patient Spontanous Breathing and Patient connected to face mask oxygen  Post-op Assessment: Report given to RN and Post -op Vital signs reviewed and stable  Post vital signs: Reviewed and stable  Last Vitals:  Vitals:   04/03/17 1514 04/03/17 1515  BP: (!) 92/50 (!) 94/49  Pulse: 61 61  Resp: 14 13  Temp:      Last Pain:  Vitals:   04/03/17 1514  TempSrc:   PainSc: Asleep         Complications: No apparent anesthesia complications

## 2017-04-03 NOTE — Progress Notes (Signed)
Assisted Dr. Miller with right, ultrasound guided, supraclavicular block. Side rails up, monitors on throughout procedure. See vital signs in flow sheet. Tolerated Procedure well. 

## 2017-04-04 NOTE — Op Note (Signed)
04/03/2017  1:42 PM  PATIENT:  [place]    PRE-OPERATIVE DIAGNOSIS:  RIGHT RADIAL HEAD FRACTURE  POST-OPERATIVE DIAGNOSIS:  Same  PROCEDURE:  OPEN REDUCTION INTERNAL FIXATION (ORIF) RIGHT PROXIMAL RADIAL FRACTURE  SURGEON:  Kainan Patty, Jewel Baize, MD  ASSISTANT: Aquilla Hacker, PA-C, he was present and scrubbed throughout the case, critical for completion in a timely fashion, and for retraction, instrumentation, and closure.   ANESTHESIA:   gen  PREOPERATIVE INDICATIONS:  Troy Kanouse is a  26 y.o. male with a diagnosis of RIGHT RADIAL HEAD FRACTURE who failed conservative measures and elected for surgical management.    The risks benefits and alternatives were discussed with the patient preoperatively including but not limited to the risks of infection, bleeding, nerve injury, cardiopulmonary complications, the need for revision surgery, among others, and the patient was willing to proceed.  OPERATIVE IMPLANTS: headless biomet screw  OPERATIVE FINDINGS: reduced fracture  BLOOD LOSS: min  COMPLICATIONS: none  TOURNIQUET TIME:  OPERATIVE PROCEDURE:  Patient was identified in the preoperative holding area and site was marked by me He was transported to the operating theater and placed on the table in supine position taking care to pad all bony prominences. After a preincinduction time out anesthesia was induced. The right uper extremity was prepped and draped in normal sterile fashion and a pre-incision timeout was performed. He received ancef for preoperative antibiotics.   I performed a lateral approach to the radial head protecting the radial nerve.   I incised the capsul in line with the incision.   I identified the fracture and reduced it to an anatomic articular surface.   I pinned it into place.   I then placed a headless compression screw and countersunk this.   I was happy with multiple xrays.   I then irrigated and closed the incision.   POST  OPERATIVE PLAN: sling, ambulate for dvt px.

## 2017-04-05 ENCOUNTER — Encounter (HOSPITAL_BASED_OUTPATIENT_CLINIC_OR_DEPARTMENT_OTHER): Payer: Self-pay | Admitting: Orthopedic Surgery

## 2018-04-21 IMAGING — CT CT ELBOW*R* W/O CM
3 series · 8 of 14 positions shown, 9 images · non-contrast
Comparison: None.

CLINICAL DATA: Skateboarding accident.  Pain and swelling.

EXAM:
CT OF THE LOWER RIGHT EXTREMITY WITHOUT CONTRAST
TECHNIQUE: Multidetector CT imaging of the right lower extremity was performed
according to the standard protocol.

[Series 5: shoulder soft · axial · 0.30mm/px · z∈[-62,+58]mm · 4 of 80 slices shown, 5 images]
[im 16/80  soft-tissue]
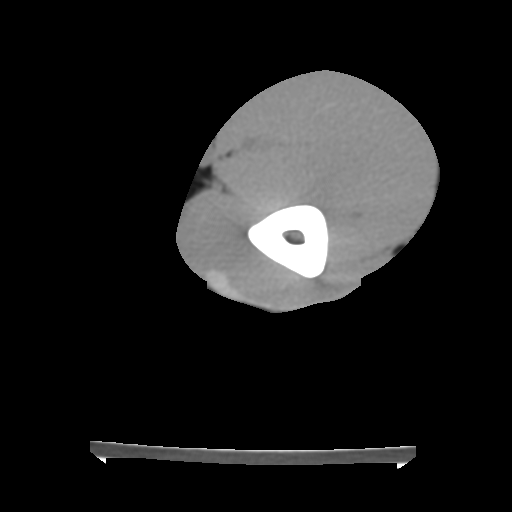
[im 16/80  bone]
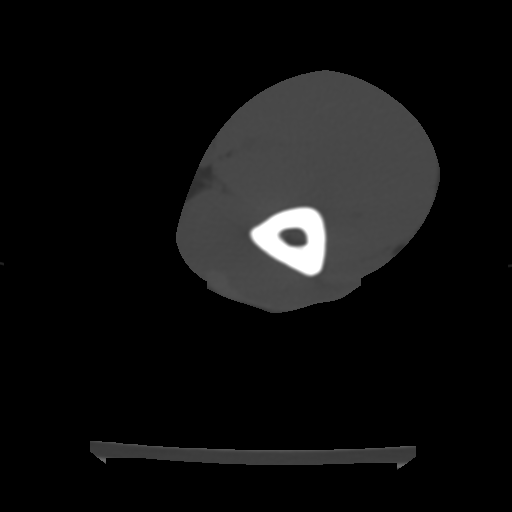
[im 32/80  bone]
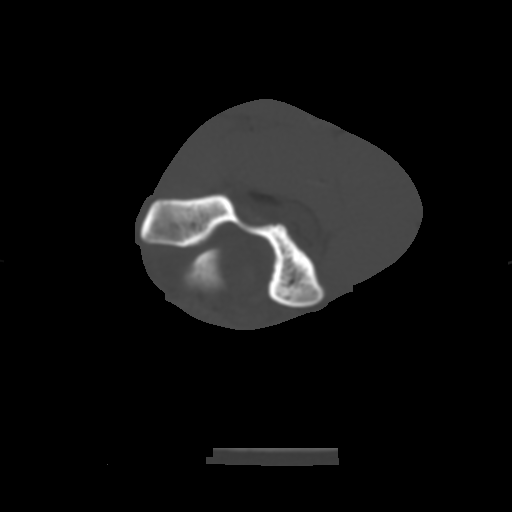
[im 48/80  bone]
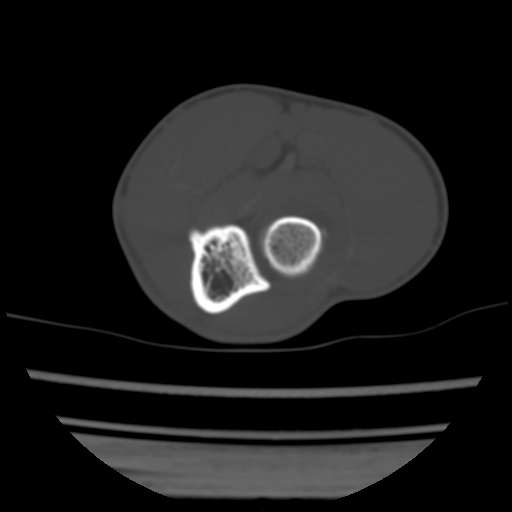
[im 64/80  bone]
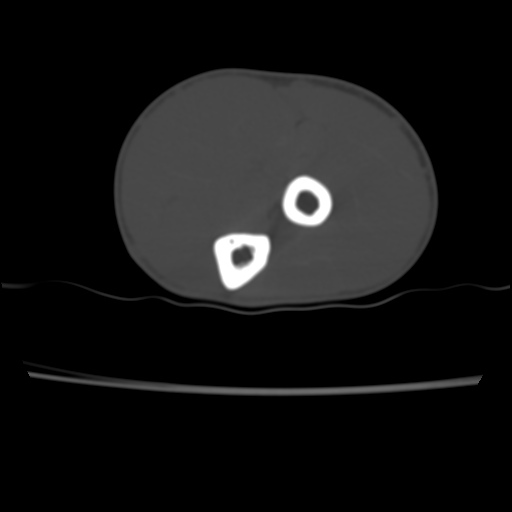

[Series 300: sag soft right · sagittal · 0.40mm/px · 2 of 53 slices shown]
[im 18/53  soft-tissue]
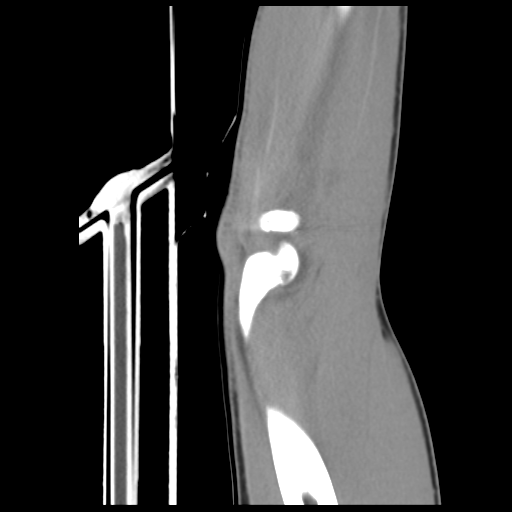
[im 35/53  soft-tissue]
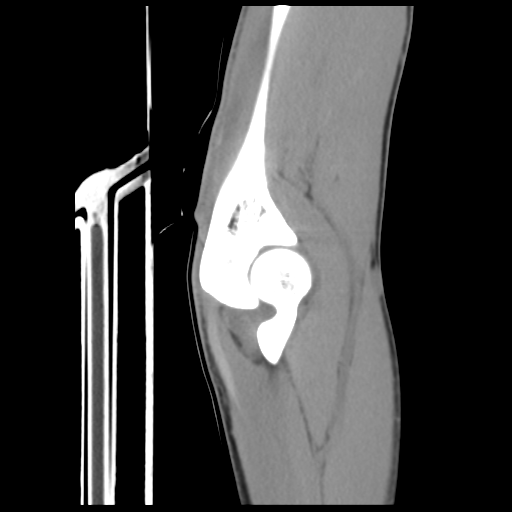

[Series 301: cor soft right · coronal · 0.40mm/px · 2 of 47 slices shown]
[im 16/47  soft-tissue]
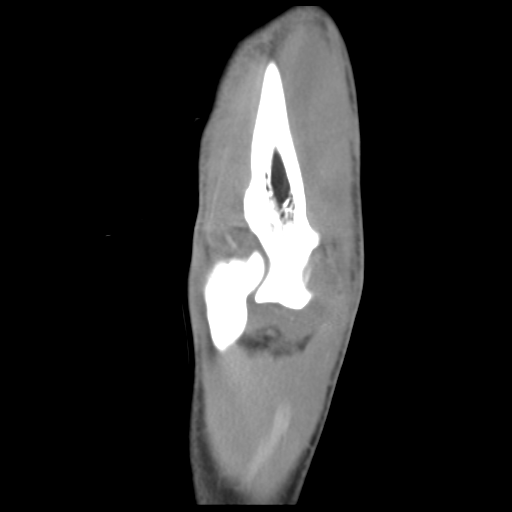
[im 31/47  soft-tissue]
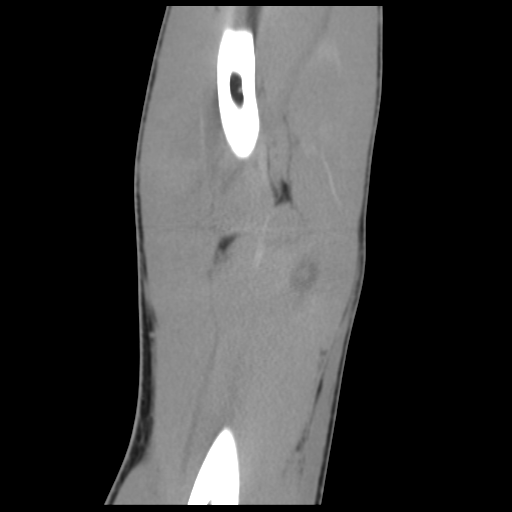

[8 of 14 positions shown; findings below may reference images not displayed]

FINDINGS: Bones/Joint/Cartilage

Minimally displaced fracture of the left radial head involving the
volar aspect with less then 1 mm of volar displacement of the
fracture fragment and with less than 1 mm of step-off at the
articular surface.

No other acute fracture or dislocation. Normal alignment.

Moderate joint effusion.

Ligaments

Ligaments are suboptimally evaluated by CT.

Muscles and Tendons
Muscles are normal. Biceps tendon is intact. Triceps tendon is
intact.

Soft tissue
No fluid collection or hematoma.  No soft tissue mass.
IMPRESSION: 1. Minimally displaced fracture of the left radial head involving
the volar aspect with 1 mm of volar displacement of the fracture
fragment and with 2 mm of step-off at the articular surface.
